# Patient Record
Sex: Female | Born: 1995 | Race: Black or African American | Hispanic: No | Marital: Single | Smoking: Current every day smoker
Health system: Southern US, Community
[De-identification: ages and names within clinical notes are randomized; demographics above are authoritative.]

## PROBLEM LIST (undated history)

## (undated) DIAGNOSIS — M1990 Unspecified osteoarthritis, unspecified site: Secondary | ICD-10-CM

## (undated) HISTORY — PX: KNEE SURGERY: SHX244

---
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
## 2016-05-15 ENCOUNTER — Emergency Department (HOSPITAL_BASED_OUTPATIENT_CLINIC_OR_DEPARTMENT_OTHER): Payer: Medicaid Other

## 2016-05-15 ENCOUNTER — Emergency Department (HOSPITAL_BASED_OUTPATIENT_CLINIC_OR_DEPARTMENT_OTHER)
Admission: EM | Admit: 2016-05-15 | Discharge: 2016-05-15 | Disposition: A | Discharge status: Home / Self Care | Payer: Medicaid Other | Source: Home / Self Care | Attending: Emergency Medicine | Admitting: Emergency Medicine

## 2016-05-15 ENCOUNTER — Emergency Department (HOSPITAL_BASED_OUTPATIENT_CLINIC_OR_DEPARTMENT_OTHER)
Admission: EM | Admit: 2016-05-15 | Discharge: 2016-05-15 | Disposition: A | Discharge status: Home / Self Care | Payer: Medicaid Other | Attending: Emergency Medicine | Admitting: Emergency Medicine

## 2016-05-15 ENCOUNTER — Encounter (HOSPITAL_BASED_OUTPATIENT_CLINIC_OR_DEPARTMENT_OTHER): Payer: Self-pay

## 2016-05-15 ENCOUNTER — Encounter (HOSPITAL_BASED_OUTPATIENT_CLINIC_OR_DEPARTMENT_OTHER): Payer: Self-pay | Admitting: Emergency Medicine

## 2016-05-15 DIAGNOSIS — J209 Acute bronchitis, unspecified: Secondary | ICD-10-CM | POA: Insufficient documentation

## 2016-05-15 DIAGNOSIS — F17210 Nicotine dependence, cigarettes, uncomplicated: Secondary | ICD-10-CM | POA: Insufficient documentation

## 2016-05-15 DIAGNOSIS — R05 Cough: Secondary | ICD-10-CM | POA: Diagnosis present

## 2016-05-15 MED ORDER — ALBUTEROL SULFATE HFA 108 (90 BASE) MCG/ACT IN AERS
2.0000 | INHALATION_SPRAY | Freq: Once | RESPIRATORY_TRACT | Status: AC
  Administered 2016-05-15: 2 via RESPIRATORY_TRACT
  Filled 2016-05-15: qty 6.7

## 2016-05-15 MED ORDER — ALBUTEROL SULFATE (2.5 MG/3ML) 0.083% IN NEBU
5.0000 mg | INHALATION_SOLUTION | Freq: Once | RESPIRATORY_TRACT | Status: AC
  Administered 2016-05-15: 5 mg via RESPIRATORY_TRACT
  Filled 2016-05-15: qty 6

## 2016-05-15 NOTE — Discharge Instructions (Signed)
Return to the ED with any concerns including difficulty breathing despite using albuterol every 4 hours, not drinking fluids, decreased urine output, vomiting and not able to keep down liquids or medications, decreased level of alertness/lethargy, or any other alarming symptoms °

## 2016-05-15 NOTE — ED Notes (Signed)
Pt yelling  at staff that she cannot breathe.  Pt yelling and cursing at staff that she is tired of waiting and wants an inhaler. NAD.  PT yelling in complete sentences.

## 2016-05-15 NOTE — ED Provider Notes (Signed)
[facility] Note   CSN: [id number] Arrival date & time: 05/15/16  1741  By signing my name below, I, Caitlin Edwards, attest that this documentation has been prepared under the direction and in the presence of Caitlin Scott, MD. Electronically Signed: Doreatha Edwards, ED Scribe. 05/15/16. 9:24 PM.   History   Chief Complaint Chief Complaint  Patient presents with  . Cough   LEVEL 5 CAVEAT: HPI and ROS limited due to pt uncooperative    HPI Caitlin Edwards is a 21 y.o. female who presents to the Emergency Department complaining of intermittent cough for 2 days with associated sneezing, wheezing, chills, subjective fever. Pt is insistent that she "can't breath" and refuses to answer questions on exam. No diagnosed h/o asthma. Pt states she frequently has similar problems breathing.    The history is provided by the patient. History limited by: uncooperative. No language interpreter was used.  Cough  This is a new problem. The current episode started 2 days ago. The problem occurs every few minutes. The problem has not changed since onset.There has been no fever.    Past Medical History:  Diagnosis Date  . Osteoarthritis     There are no active problems to display for this patient.   Past Surgical History:  Procedure Laterality Date  . KNEE SURGERY      OB History    No data available       Home Medications    Prior to Admission medications   Not on File    Family History No family history on file.  Social History Social History  Substance Use Topics  . Smoking status: Current Some Day Smoker    Types: Cigarettes  . Smokeless tobacco: Never Used  . Alcohol use No     Allergies   Amoxil [amoxicillin] and Penicillins   Review of Systems Review of Systems  Unable to perform ROS: Other (pt uncooperative )   Physical Exam Updated Vital Signs BP 117/72 (BP Location: Left Arm)   Pulse (!) 105   Temp 99.1 F (37.3 C) (Oral)   Resp 20    Ht  (1.803 m)   Wt (!) 324 lb (147 kg)   LMP 04/24/2016   SpO2 100%   BMI 45.19 kg/m  Vitals reviewed Physical Exam Physical Examination: General appearance - alert, well appearing, and in no distress, yelling and cursing at staff members due wait and screaming that she nearly died while waiting in exam room- refusing to answer history questions Mental status - alert, oriented to person, place, and time Eyes - no conjunctival injection, no scleral icterus Mouth - mucous membranes moist, pharynx normal without lesions Neck - supple, no significant adenopathy Chest - mild bilateral wheezing, no retractions, normal respiratory effort, BSS Heart - normal rate, regular rhythm, normal S1, S2, no murmurs, rubs, clicks or gallops  Neurological - alert, oriented, normal speech Extremities - peripheral pulses normal, no pedal edema, no clubbing or cyanosis Skin - normal coloration and turgor Psych- very anxious, tearful and angry, shouting very loudly and belligerent  ED Treatments / Results   DIAGNOSTIC STUDIES: Oxygen Saturation is 100% on RA, normal by my interpretation.    COORDINATION OF CARE: 9:15 PM Discussed treatment plan with pt at bedside which includes CXR and pt agreed to plan.    Labs (all labs ordered are listed, but only abnormal results are displayed) Labs Reviewed - No data to display  EKG  EKG Interpretation None  Radiology Dg Chest 2 View  Result Date: 05/15/2016 CLINICAL DATA:  Cough, wheezing and shortness of breath for 3 days, history of bronchitis EXAM: CHEST  2 VIEW COMPARISON:  12/20/2014 FINDINGS: Normal heart size, mediastinal contours, and pulmonary vascularity. Lungs clear. No pleural effusion or pneumothorax. Bones unremarkable. IMPRESSION: Normal exam. Electronically Signed   By: Ulyses Southward M.D.   On: 05/15/2016 18:39    Procedures Procedures (including critical care time)  Medications Ordered in ED Medications  albuterol  (PROVENTIL) (2.5 MG/3ML) 0.083% nebulizer solution 5 mg (5 mg Nebulization Given 05/15/16 2135)  albuterol (PROVENTIL HFA;VENTOLIN HFA) 108 (90 Base) MCG/ACT inhaler 2 puff (2 puffs Inhalation Given 05/15/16 2149)     Initial Impression / Assessment and Plan / ED Course  I have reviewed the triage vital signs and the nursing notes.  Pertinent labs & imaging results that were available during my care of the patient were reviewed by me and considered in my medical decision making (see chart for details).   in to see patient, she has been yelling and shouting about the long wait- she is cursing and shouting that she feels she nearly died in the exam room and was not able to call for help.  I entered the room and asked her to please tell me her symptoms.  She replied that "you are the doctor why are you asking me questions"   I explained that I needed to find out some information about her symptoms to better understand the cause of her feeling short of breath.   She yelled "I feel like I can't breathe, that's all you need to know"- of note she had no signs of respiratory distress.  I referred to the triage note mentioning she had flu like symptoms- she then said "I never said that- I'm only 21 years old- I'm calling my mother".  She then called her mother and talked with her on the phone, while I waited in the room, the charge nurse came into the room to explain to the patient that there were many other patients waiting and could she please get off the phone.  Patient continued to yell and did not want to answer questions.  I and the charge nurse explained numerous times that we were trying to help her and apologized for the wait time.  I then asked if it would be alright if I examined her.  She said "yes" but was still very visibly upset and angry.  She did have some mild wheezing bilaterally.  I discussed with her that I did hear some wheezing, that I was going to order a breathing treatment and then she would  have an albuterol inhaler to go home with before discharge. I also let her know that her xray was normal..    Pt with most likley viral bronchitis- discharged in stable condition with allbuterol inhaler.  Discharged with strict return precautions.  Pt agreeable with plan.   Final Clinical Impressions(s) / ED Diagnoses   Final diagnoses:  Bronchospasm with bronchitis, acute    New Prescriptions There are no discharge medications for this patient.   I personally performed the services described in this documentation, which was scribed in my presence. The recorded information has been reviewed and is accurate.     Caitlin Scott, MD 05/16/16 [phone]

## 2016-05-15 NOTE — ED Notes (Signed)
Pt left before being seen by EDP. Sts she had to be at work, but she'll come back later.

## 2016-05-15 NOTE — ED Triage Notes (Signed)
c/o flu like sx x 3 days-NAD-steady gait-LWBS today

## 2016-05-15 NOTE — ED Notes (Signed)
ED Provider at bedside. 

## 2016-05-15 NOTE — ED Triage Notes (Signed)
Pt having runny nose, cough, ear pain, chest congestion for several days.

## 2021-03-30 ENCOUNTER — Encounter (HOSPITAL_COMMUNITY): Payer: Self-pay

## 2021-03-30 ENCOUNTER — Emergency Department (HOSPITAL_COMMUNITY): Payer: Medicaid Other

## 2021-03-30 ENCOUNTER — Other Ambulatory Visit: Payer: Self-pay

## 2021-03-30 ENCOUNTER — Emergency Department (HOSPITAL_COMMUNITY)
Admission: EM | Admit: 2021-03-30 | Discharge: 2021-03-30 | Disposition: A | Discharge status: Home / Self Care | Payer: Medicaid Other | Attending: Emergency Medicine | Admitting: Emergency Medicine

## 2021-03-30 ENCOUNTER — Emergency Department (HOSPITAL_COMMUNITY)
Admission: EM | Admit: 2021-03-30 | Discharge: 2021-03-30 | Disposition: A | Discharge status: Home / Self Care | Payer: Self-pay | Attending: Emergency Medicine | Admitting: Emergency Medicine

## 2021-03-30 DIAGNOSIS — Z5900 Homelessness unspecified: Secondary | ICD-10-CM | POA: Insufficient documentation

## 2021-03-30 DIAGNOSIS — G8929 Other chronic pain: Secondary | ICD-10-CM | POA: Insufficient documentation

## 2021-03-30 DIAGNOSIS — M79604 Pain in right leg: Secondary | ICD-10-CM | POA: Insufficient documentation

## 2021-03-30 DIAGNOSIS — M25561 Pain in right knee: Secondary | ICD-10-CM | POA: Insufficient documentation

## 2021-03-30 DIAGNOSIS — Z046 Encounter for general psychiatric examination, requested by authority: Secondary | ICD-10-CM | POA: Insufficient documentation

## 2021-03-30 NOTE — ED Notes (Signed)
Pt provided crackers and peanut butter with something to drink per providers request. Caitlin Edwards to walk away and pt stated that she had a questions. When asked the pt what her question was she stated "what did she need to do to check into a psyc facility" advised pt this RN would have to speak with the provider and make the provider aware of same

## 2021-03-30 NOTE — ED Provider Notes (Signed)
[facility] EMERGENCY DEPARTMENT Provider Note   CSN: [id number] Arrival date & time: 03/30/21  1324     History  Chief Complaint  Patient presents with   Psychiatric Evaluation    Caitlin Edwards is a 26 y.o. female.  This is a 26 y.o. female  with significant medical history as below, including homeless who presents to the ED with complaint of seeking some place to sleep.  Patient ports that she was at [facility], she left in the lobby.  She was requesting to stay in the hospital overnight because she did not want to stay with her mom or to be outside.  She is homeless.  She denies hallucinations or delusions, no suicidal or homicidal ideation or plan.  No acute medical complaints.  Denies daily medications.  Denies any illicit drug use or alcohol use.  No trauma.  No fevers or chills.  No change in bowel or bladder function, no change to oral intake  Per chart review patient was discharged from [facility]  ED earlier this morning, diagnosed with malingering.  Per EMS patient was also trespassed from that facility.   Past Medical History: No date: Osteoarthritis  Past Surgical History: No date: KNEE SURGERY    The history is provided by the patient. No language interpreter was used.      Home Medications Prior to Admission medications   Medication Sig Start Date End Date Taking? Authorizing Provider  mometasone (NASONEX) 50 MCG/ACT nasal spray Place 2 sprays into the nose daily. 07/13/14 03/27/15  Hess, Nada Boozer, PA-C      Allergies    Amoxil [amoxicillin] and Penicillins    Review of Systems   Review of Systems  Constitutional:  Negative for activity change and fever.  HENT:  Negative for facial swelling and trouble swallowing.   Eyes:  Negative for discharge and redness.  Respiratory:  Negative for cough and shortness of breath.   Cardiovascular:  Negative for chest pain and palpitations.  Gastrointestinal:  Negative for abdominal  pain and nausea.  Genitourinary:  Negative for dysuria and flank pain.  Musculoskeletal:  Negative for back pain and gait problem.  Skin:  Negative for pallor and rash.  Neurological:  Negative for syncope and headaches.   Physical Exam Updated Vital Signs BP (!) 140/120 (BP Location: Right Arm)    Pulse 95    Temp 97.9 F (36.6 C) (Oral)    Resp 16    Ht 5\' 11"  (1.803 m)    Wt (!) 147 kg    SpO2 98%    BMI 45.20 kg/m  Physical Exam Vitals and nursing note reviewed.  Constitutional:      General: She is not in acute distress.    Appearance: Normal appearance. She is obese.  HENT:     Head: Normocephalic and atraumatic. No raccoon eyes, Battle's sign, right periorbital erythema or left periorbital erythema.     Right Ear: External ear normal.     Left Ear: External ear normal.     Nose: Nose normal.     Mouth/Throat:     Mouth: Mucous membranes are moist.  Eyes:     General: No scleral icterus.       Right eye: No discharge.        Left eye: No discharge.  Cardiovascular:     Rate and Rhythm: Normal rate and regular rhythm.     Pulses: Normal pulses.     Heart sounds: Normal heart  sounds.  Pulmonary:     Effort: Pulmonary effort is normal. No respiratory distress.     Breath sounds: Normal breath sounds.  Abdominal:     General: Abdomen is flat.     Tenderness: There is no abdominal tenderness.  Musculoskeletal:        General: Normal range of motion.     Cervical back: Normal range of motion.     Right lower leg: No edema.     Left lower leg: No edema.  Skin:    General: Skin is warm and dry.     Capillary Refill: Capillary refill takes less than 2 seconds.  Neurological:     Mental Status: She is alert and oriented to person, place, and time.     GCS: GCS eye subscore is 4. GCS verbal subscore is 5. GCS motor subscore is 6.  Psychiatric:        Attention and Perception: Attention and perception normal. She does not perceive auditory or visual hallucinations.         Mood and Affect: Mood normal. Affect is not angry.        Speech: Speech normal.        Behavior: Behavior normal. Behavior is not agitated, aggressive or combative. Behavior is cooperative.        Thought Content: Thought content normal. Thought content does not include homicidal or suicidal ideation. Thought content does not include homicidal or suicidal plan.    ED Results / Procedures / Treatments   Labs (all labs ordered are listed, but only abnormal results are displayed) Labs Reviewed - No data to display  EKG None  Radiology No results found.  Procedures Procedures    Medications Ordered in ED Medications - No data to display  ED Course/ Medical Decision Making/ A&P                           Medical Decision Making   CC: Homeless  This patient presents to the Emergency Department for the above complaint. This involves an extensive number of treatment options and is a complaint that carries with it a high risk of complications and morbidity. Vital signs were reviewed. Serious etiologies considered.  Record review:  Previous records obtained and reviewed   Additional history obtained from EMS  Medical and surgical history as noted above.   Work up as above, notable for:   Social determinants of health include - homelessness  Management: Pt given food, something to drink, o/p resources regarding shelters in the area.  Given info regarding [facility].  She is not psychotic.  Not intoxicated.  Ambulatory w/ steady gait.   Pt is homeless, asking if she can "stay the night in the ER." No acute psychiatric disturbance noted on my evaluation. Medically stable. Discussed local shelter options with the patient and community resources that she could utilize. Pt's mother also lives locally and advised her to call her mother to help assist with her housing.    Stable for discharge   The patient improved significantly and was discharged in stable condition. Detailed  discussions were had with the patient regarding current findings, and need for close f/u with PCP or on call doctor. The patient has been instructed to return immediately if the symptoms worsen in any way for re-evaluation. Patient verbalized understanding and is in agreement with current care plan. All questions answered prior to discharge.      This chart was dictated using  voice recognition software.  Despite best efforts to proofread,  errors can occur which can change the documentation meaning.         Final Clinical Impression(s) / ED Diagnoses Final diagnoses:  Homeless    Rx / DC Orders ED Discharge Orders     None         Sloan Leiter, DO 03/30/21 [phone]

## 2021-03-30 NOTE — Discharge Instructions (Addendum)
It was a pleasure caring for you today in the emergency department. ° °Please return to the emergency department for any worsening or worrisome symptoms. ° ° °

## 2021-03-30 NOTE — ED Notes (Signed)
Pt was previously seen in ED at 0553AM this morning for abdominal pain. Pt was discharged and is now back for right leg pain. Pt states she walked to [address] and right leg pain worsened. Denies hx of falls/injury. No obvious swelling or deformity. Alert and oriented x 4. Will continue to monitor.

## 2021-03-30 NOTE — ED Provider Notes (Signed)
[facility] EMERGENCY DEPARTMENT Provider Note   CSN: [id number] Arrival date & time: 03/30/21  0160     History  Chief Complaint  Patient presents with   Leg Pain    Caitlin Edwards is a 26 y.o. female presenting due to right knee pain.  Of note she was discharged a few hours ago.  Patient has a history of homelessness and malingering.  She says that her knee pain has been "bothering me since I was a little girl."  Says that it is difficult to bear weight or walk on it.  She left the hospital earlier, walked up to [facility] and called 911.  Home Medications Prior to Admission medications   Medication Sig Start Date End Date Taking? Authorizing Provider  mometasone (NASONEX) 50 MCG/ACT nasal spray Place 2 sprays into the nose daily. 07/13/14 03/27/15  Hess, Nada Boozer, PA-C      Allergies    Amoxil [amoxicillin] and Penicillins    Review of Systems   Review of Systems See HPI  Physical Exam Updated Vital Signs BP 112/70 (BP Location: Right Arm)    Pulse 78    Temp 97.7 F (36.5 C) (Oral)    Resp 15    Ht 5\' 11"  (1.803 m)    Wt (!) 147 kg    SpO2 99%    BMI 45.20 kg/m  Physical Exam Vitals and nursing note reviewed.  Constitutional:      Appearance: Normal appearance.  HENT:     Head: Normocephalic and atraumatic.  Eyes:     General: No scleral icterus.    Conjunctiva/sclera: Conjunctivae normal.  Pulmonary:     Effort: Pulmonary effort is normal. No respiratory distress.  Musculoskeletal:     Comments: Tenderness to palpation of the anterior, medial and lateral.  No abrasions or lacerations.  Skin:    Findings: No rash.  Neurological:     Mental Status: She is alert.  Psychiatric:        Mood and Affect: Mood normal.    ED Results / Procedures / Treatments   Labs (all labs ordered are listed, but only abnormal results are displayed) Labs Reviewed - No data to display  EKG None  Radiology DG Knee Right Port  Result Date:  03/30/2021 CLINICAL DATA:  Pain without injury EXAM: PORTABLE RIGHT KNEE - 1-2 VIEW COMPARISON:  11/29/2013 FINDINGS: Degenerative marginal spurring which is generalized. Mild medial compartment narrowing is possible. No joint effusion, fracture, or subluxation. IMPRESSION: Generalized soft tissue swelling. Premature degenerative spurring which is generalized. Electronically Signed   By: 12/01/2013 M.D.   On: 03/30/2021 09:45    Procedures Procedures   Medications Ordered in ED Medications - No data to display  ED Course/ Medical Decision Making/ A&P                           Medical Decision Making Amount and/or Complexity of Data Reviewed Radiology: ordered.   26 year old female presenting with knee pain that she says has been present all her life.  She was discharged from the emergency department at around 630 this morning after psychiatric evaluation and requesting to spend the night in the emergency department.  She was given local shelter options and her mother lives in town but she does not want to stay with her.  X-ray of the knee was ordered and interpreted by me.  I agree with the radiologist that there are no acute  findings, just to degeneration that was noted on past scans.  Patient was told this and then asked about multiple other bumps and scars around her body.  I suspect this visit is secondary to homelessness and a desire to stay in the emergency department.  I do not have any indication to keep her here and she will be discharged at this time with knee immobilizer, crutches and the suggestion to call her mother.  Final Clinical Impression(s) / ED Diagnoses Final diagnoses:  Chronic pain of right knee    Rx / DC Orders Results and diagnoses were explained to the patient. Return precautions discussed in full. Patient had no additional questions and expressed complete understanding.   This chart was dictated using voice recognition software.  Despite best efforts to  proofread,  errors can occur which can change the documentation meaning.    Woodroe Chen 03/30/21 0957    Linwood Dibbles, MD 03/31/21 [phone]

## 2021-03-30 NOTE — ED Triage Notes (Signed)
Picked up by EMS across the street. Came here for right knee pain. Denies injury/falls. No swelling or obvious deformity per EMS. This is 2nd visit today with last one at 52AM.

## 2021-03-30 NOTE — ED Triage Notes (Signed)
BIB [facility]. Pt called EMS multiple times tonight and was transported to [facility] pt [facility] x 3 and was trespassed on the 3rd time. Pt called EMS again from outside of [facility] and requested to be transported here. C/o abd pain, [place] and weakness from a contaminated dollar bill from several weeks ago. Pt has had an appetite and ate and drank tonight and able to tolerate same. Denies SI/HI or any psych hx.

## 2021-03-30 NOTE — Discharge Instructions (Addendum)
There is degeneration in the right knee.  Please use the knee brace when walking.  Crutches should also be utilized.  Read the information about joint pain attached to these discharge papers.

## 2021-03-30 NOTE — Progress Notes (Signed)
Orthopedic Tech Progress Note Patient Details:  Caitlin Edwards June 03, 1995 [id number]  Ortho Devices Type of Ortho Device: Knee Immobilizer Ortho Device/Splint Location: rle Ortho Device/Splint Interventions: Ordered, Application, Adjustment   Post Interventions Patient Tolerated: Well  Edwina Barth 03/30/2021, 10:23 AM

## 2021-04-10 ENCOUNTER — Encounter (HOSPITAL_COMMUNITY): Payer: Self-pay | Admitting: Emergency Medicine

## 2021-04-10 ENCOUNTER — Other Ambulatory Visit: Payer: Self-pay

## 2021-04-10 ENCOUNTER — Emergency Department (HOSPITAL_COMMUNITY)
Admission: EM | Admit: 2021-04-10 | Discharge: 2021-04-10 | Disposition: A | Discharge status: Home / Self Care | Payer: Medicaid Other | Attending: Emergency Medicine | Admitting: Emergency Medicine

## 2021-04-10 DIAGNOSIS — Z5900 Homelessness unspecified: Secondary | ICD-10-CM | POA: Insufficient documentation

## 2021-04-10 DIAGNOSIS — R112 Nausea with vomiting, unspecified: Secondary | ICD-10-CM | POA: Insufficient documentation

## 2021-04-10 NOTE — ED Notes (Signed)
Refusing to provide urine specimen. Refusing to eat / drink.

## 2021-04-10 NOTE — ED Provider Notes (Signed)
[facility] EMERGENCY DEPARTMENT Provider Note   CSN: [id number] Arrival date & time: 04/10/21  0004     History  Chief Complaint  Patient presents with   Accidentally Drank Urine     Caitlin Edwards is a 26 y.o. female.  HPI     This is a 26 year old female with no reported past medical history who presents with nausea and vomiting.  Patient reports "I am sick."  Reports an episode of emesis earlier today.  She is currently homeless and living on the streets.  She initially told triage that she had vomiting last week after drinking her urine.  However, she endorses to me that she vomited once today.  She is not having any abdominal pain.  Reports normal bowel movements.  Does not believe herself to be pregnant.  Reports that she has a lead on a place to stay but "it will not be ready until next week."  Home Medications Prior to Admission medications   Medication Sig Start Date End Date Taking? Authorizing Provider  mometasone (NASONEX) 50 MCG/ACT nasal spray Place 2 sprays into the nose daily. 07/13/14 03/27/15  Hess, Nada Boozer, PA-C      Allergies    Amoxil [amoxicillin] and Penicillins    Review of Systems   Review of Systems  Constitutional:  Negative for fever.  Respiratory:  Negative for shortness of breath.   Cardiovascular:  Negative for chest pain.  Gastrointestinal:  Positive for vomiting.  All other systems reviewed and are negative.  Physical Exam Updated Vital Signs BP 138/85 (BP Location: Right Arm)    Pulse 86    Temp 97.8 F (36.6 C) (Oral)    Resp 15    SpO2 100%  Physical Exam Vitals and nursing note reviewed.  Constitutional:      Appearance: She is well-developed.     Comments: Disheveled appearing but nontoxic, obese  HENT:     Head: Normocephalic and atraumatic.     Mouth/Throat:     Mouth: Mucous membranes are moist.  Eyes:     Pupils: Pupils are equal, round, and reactive to light.  Cardiovascular:     Rate and Rhythm:  Normal rate and regular rhythm.     Heart sounds: Normal heart sounds.  Pulmonary:     Effort: Pulmonary effort is normal. No respiratory distress.     Breath sounds: No wheezing.  Abdominal:     General: There is no distension.     Palpations: Abdomen is soft.  Musculoskeletal:     Cervical back: Neck supple.  Skin:    General: Skin is warm and dry.  Neurological:     Mental Status: She is alert and oriented to person, place, and time.  Psychiatric:        Mood and Affect: Mood normal.    ED Results / Procedures / Treatments   Labs (all labs ordered are listed, but only abnormal results are displayed) Labs Reviewed  URINALYSIS, ROUTINE W REFLEX MICROSCOPIC  PREGNANCY, URINE    EKG None  Radiology No results found.  Procedures Procedures    Medications Ordered in ED Medications - No data to display  ED Course/ Medical Decision Making/ A&P                           Medical Decision Making Amount and/or Complexity of Data Reviewed Labs: ordered.   This patient presents to the ED for concern of vomiting, drinking  her own pee, this involves an extensive number of treatment options, and is a complaint that carries with it a high risk of complications and morbidity.  The differential diagnosis includes gastritis, gastroenteritis, malingering  MDM:    This is a 26 year old female who presents with reported vomiting and drinking her own urine.  She is very vague in her history taking.  She is adamant that she vomited earlier today.  Has no abdominal pain.  At some point the last 2 weeks has also dropped her own urine.  Unclear why.  She is refusing any work-up and just wants to sleep in the hallway.  She will not provide urine.  I have tried to orally hydrate her and give her something to eat.  She continues to refuse.  Highly suspect that she is looking for a place to sleep.  Her exam is benign.  Have low suspicion for acute emergent process. (Labs, imaging)  Labs: I  Ordered, and personally interpreted labs.  The pertinent results include: None  Imaging Studies ordered: I ordered imaging studies including none I independently visualized and interpreted imaging. I agree with the radiologist interpretation  Additional history obtained from chart review.  External records from outside source obtained and reviewed including prior visit  Critical Interventions: None  Consultations: I requested consultation with the none,  and discussed lab and imaging findings as well as pertinent plan - they recommend: None  Cardiac Monitoring: The patient was maintained on a cardiac monitor.  I personally viewed and interpreted the cardiac monitored which showed an underlying rhythm of: N/A  Reevaluation: After the interventions noted above, I reevaluated the patient and found that they have :stayed the same   Considered admission for: N/A  Social Determinants of Health: Homeless  Disposition: Discharge  Co morbidities that complicate the patient evaluation  Past Medical History:  Diagnosis Date   Osteoarthritis      Medicines No orders of the defined types were placed in this encounter.   I have reviewed the patients home medicines and have made adjustments as needed  Problem List / ED Course: Problem List Items Addressed This Visit   None Visit Diagnoses     Homeless    -  Primary                   Final Clinical Impression(s) / ED Diagnoses Final diagnoses:  Homeless    Rx / DC Orders ED Discharge Orders     None         Aletta Edmunds, Mayer Masker, MD 04/10/21 [phone]

## 2021-04-10 NOTE — ED Triage Notes (Signed)
Patient arrived with EMS from street ( homeless) accidentally drank her urine 2 weeks ago , reports emesis x2 last week . No nausea or pain at arrival . Denies SI or HI . No hallucinations.

## 2021-04-10 NOTE — ED Notes (Signed)
Refused discharge vital signs.

## 2021-04-17 ENCOUNTER — Encounter (HOSPITAL_COMMUNITY): Payer: Self-pay

## 2021-04-17 ENCOUNTER — Emergency Department (HOSPITAL_COMMUNITY)
Admission: EM | Admit: 2021-04-17 | Discharge: 2021-04-17 | Disposition: A | Discharge status: Home / Self Care | Payer: Medicaid Other | Attending: Emergency Medicine | Admitting: Emergency Medicine

## 2021-04-17 ENCOUNTER — Other Ambulatory Visit: Payer: Self-pay

## 2021-04-17 DIAGNOSIS — Z5900 Homelessness unspecified: Secondary | ICD-10-CM | POA: Insufficient documentation

## 2021-04-17 DIAGNOSIS — R519 Headache, unspecified: Secondary | ICD-10-CM | POA: Insufficient documentation

## 2021-04-17 MED ORDER — ACETAMINOPHEN 500 MG PO TABS
1000.0000 mg | ORAL_TABLET | Freq: Once | ORAL | Status: AC
  Administered 2021-04-17: 1000 mg via ORAL
  Filled 2021-04-17: qty 2

## 2021-04-17 NOTE — Progress Notes (Signed)
CSW spoke with patient about shelter resources. Patient stated she was staying in a shelter in [facility] but got kicked out because the "lady" stated she was doing something that she wasn't. Patient went on to say that people shouldn't be homeless and that animals sleep outside and not people. Patient stated she did not want any shelter resources. CSW told patient she would leave the list with her nurse if she changes her mind. CSW also attached it to patients AVS. Patient stated she did not need anything else.  ?

## 2021-04-17 NOTE — Discharge Instructions (Addendum)
Follow-up with a primary care doctor for further care and treatment as soon as possible.  If you stay in [place], you can go to the [facility]. ?

## 2021-04-17 NOTE — ED Provider Triage Note (Signed)
Emergency Medicine Provider Triage Evaluation Note ? ?Caitlin Edwards , a 26 y.o. female  was evaluated in triage.  Pt complains of being able to taste the medicine she was administered at her last ED visit when she pushes on her forehead. Also notes a frontal headache. No medications taken PTA. She is homeless. ? ?Review of Systems  ?Positive: As above ?Negative: As above ? ?Physical Exam  ?BP 127/84 (BP Location: Right Arm)   Pulse (!) 109   Temp 98.4 ?F (36.9 ?C) (Oral)   Resp 18   SpO2 100%  ?Gen:   Awake, no distress   ?Resp:  Normal effort  ?MSK:   Moves extremities without difficulty  ?Other:  Speech clear. No focal deficits noted. ? ?Medical Decision Making  ?Medically screening exam initiated at 1:08 AM.  Appropriate orders placed.  Caitlin Edwards was informed that the remainder of the evaluation will be completed by another provider, this initial triage assessment does not replace that evaluation, and the importance of remaining in the ED until their evaluation is complete. ? ?Frontal headache; homelessness ?  ?Antonietta Breach, PA-C ?04/17/21 0113 ? ?

## 2021-04-17 NOTE — ED Notes (Signed)
Patient reports tasting medicine when she presses on her forehead and states that she feels like she is going blind. Patient states that she would take care of this at home but doesn't have a home right now. Patient states that she thinks she has "medicine foot" because she has a callous on her foot. Endorses headache 10/10. VSS. ?

## 2021-04-17 NOTE — ED Notes (Signed)
Pt given sandwich bag 

## 2021-04-17 NOTE — ED Provider Notes (Signed)
?[facility] EMERGENCY DEPARTMENT ?Provider Note ? ? ?CSN: [id number] ?Arrival date & time: 04/17/21  0023 ? ?  ? ?History ? ?Chief Complaint  ?Patient presents with  ? Headache  ? ? ?Caitlin Edwards is a 26 y.o. female. ? ?HPI ?She presents to the ED for evaluation of headache.  She was previously evaluated for the same problem at another facility, yesterday.  She had a prolonged ED waiting room evaluation, prior to being seen by me at this time (11:30 AM).  She is reported to be homeless and has had multiple recent ED evaluations.  It appears this started in November 2022 and has been persistent since then. ? ?Patient came here by EMS from [place], [place] to be evaluated.  She feels like she can still taste medicine that she took yesterday morning, for headache.  She states she is supposed to be on medications for blood pressure but cannot afford it.  She states that she gets a check every month, the next one is due to be sent to her in 6 days.  She has to pick it up from the post office in [facility].  She has had some of abdominal cramping recently.  She denies vomiting, fever, cough or shortness of breath.  She states she wants to talk to the Child psychotherapist. ?  ? ?Home Medications ?Prior to Admission medications   ?Medication Sig Start Date End Date Taking? Authorizing Provider  ?mometasone (NASONEX) 50 MCG/ACT nasal spray Place 2 sprays into the nose daily. 07/13/14 03/27/15  Hess, Nada Boozer, PA-C  ?   ? ?Allergies    ?Penicillins, Amoxil [amoxicillin], and Sulfamethoxazole   ? ?Review of Systems   ?Review of Systems ? ?Physical Exam ?Updated Vital Signs ?BP (!) 158/84 (BP Location: Right Arm)   Pulse 94   Temp 97.7 ?F (36.5 ?C) (Oral)   Resp 16   Ht 5\' 11"  (1.803 m)   Wt (!) 147 kg   SpO2 100%   BMI 45.19 kg/m?  ?Physical Exam ?Vitals and nursing note reviewed.  ?Constitutional:   ?   General: She is not in acute distress. ?   Appearance: She is well-developed. She is obese. She is  not ill-appearing, toxic-appearing or diaphoretic.  ?HENT:  ?   Head: Normocephalic and atraumatic.  ?   Right Ear: External ear normal.  ?   Left Ear: External ear normal.  ?Eyes:  ?   Conjunctiva/sclera: Conjunctivae normal.  ?   Pupils: Pupils are equal, round, and reactive to light.  ?Neck:  ?   Trachea: Phonation normal.  ?Cardiovascular:  ?   Rate and Rhythm: Normal rate.  ?Pulmonary:  ?   Effort: Pulmonary effort is normal.  ?Abdominal:  ?   General: There is no distension.  ?Musculoskeletal:     ?   General: Normal range of motion.  ?   Cervical back: Normal range of motion and neck supple.  ?Skin: ?   General: Skin is warm and dry.  ?Neurological:  ?   Mental Status: She is alert and oriented to person, place, and time.  ?   Cranial Nerves: No cranial nerve deficit.  ?   Sensory: No sensory deficit.  ?   Motor: No abnormal muscle tone.  ?   Coordination: Coordination normal.  ?Psychiatric:     ?   Mood and Affect: Mood normal.     ?   Behavior: Behavior normal.  ?   Comments: She has  rambling thoughts and concerns, but appears to be goal oriented to protect herself and finding food.  ? ? ?ED Results / Procedures / Treatments   ?Labs ?(all labs ordered are listed, but only abnormal results are displayed) ?Labs Reviewed - No data to display ? ?EKG ?None ? ?Radiology ?No results found. ? ?Procedures ?Procedures  ? ? ?Medications Ordered in ED ?Medications  ?acetaminophen (TYLENOL) tablet 1,000 mg (1,000 mg Oral Given 04/17/21 0121)  ? ? ?ED Course/ Medical Decision Making/ A&P ?Clinical Course as of 04/17/21 1231  ?Thu Apr 17, 2021  ?1230 She has been seen by social work and given resources. [EW]  ?  ?Clinical Course User Index ?[EW] Mancel Bale, MD  ? ?                        ?Medical Decision Making ?Patient here for evaluation of homelessness and a place to get assistance in [place].  She usually lives in [place]. ? ?Amount and/or Complexity of Data Reviewed ?Independent Historian:  ?   Details: She  is a cogent historian ?External Data Reviewed: labs and notes. ?   Details: Prior evaluations at the emergency department in [facility].  Previously prescribed antihypertensive medication, not currently taking it ? ?Risk ?Decision regarding hospitalization. ?Diagnosis or treatment significantly limited by social determinants of health. ?Risk Details: Patient presenting with vague symptoms not requiring evaluation in this ED, or hospitalization at this time.  Patient was connected with the case management and social work team, to receive appropriate assistance.  Anticipate she will be discharged after talking with them. ? ? ? ? ? ? ? ? ? ? ?Final Clinical Impression(s) / ED Diagnoses ?Final diagnoses:  ?Homelessness  ? ? ?Rx / DC Orders ?ED Discharge Orders   ? ? None  ? ?  ? ? ?  ?Mancel Bale, MD ?04/17/21 1231 ? ?

## 2021-04-17 NOTE — ED Triage Notes (Signed)
Arrives EMS with c/o headache. Pt admits when she presses on her forehead she can taste tylenol she was given at previous encounter. Also reports socioeconomic difficulties.  ?

## 2022-02-13 DIAGNOSIS — R112 Nausea with vomiting, unspecified: Secondary | ICD-10-CM | POA: Diagnosis not present

## 2022-02-13 DIAGNOSIS — R197 Diarrhea, unspecified: Secondary | ICD-10-CM | POA: Diagnosis not present

## 2022-02-13 DIAGNOSIS — Z3202 Encounter for pregnancy test, result negative: Secondary | ICD-10-CM | POA: Diagnosis not present

## 2022-06-06 DIAGNOSIS — R1013 Epigastric pain: Secondary | ICD-10-CM | POA: Diagnosis not present

## 2022-06-06 DIAGNOSIS — K219 Gastro-esophageal reflux disease without esophagitis: Secondary | ICD-10-CM | POA: Diagnosis not present

## 2022-06-10 DIAGNOSIS — K819 Cholecystitis, unspecified: Secondary | ICD-10-CM | POA: Diagnosis not present

## 2022-06-10 DIAGNOSIS — K8020 Calculus of gallbladder without cholecystitis without obstruction: Secondary | ICD-10-CM | POA: Diagnosis not present

## 2022-06-10 DIAGNOSIS — R112 Nausea with vomiting, unspecified: Secondary | ICD-10-CM | POA: Diagnosis not present

## 2022-06-10 DIAGNOSIS — K3580 Unspecified acute appendicitis: Secondary | ICD-10-CM | POA: Diagnosis not present

## 2022-06-10 DIAGNOSIS — K810 Acute cholecystitis: Secondary | ICD-10-CM | POA: Diagnosis not present

## 2022-06-10 DIAGNOSIS — R1011 Right upper quadrant pain: Secondary | ICD-10-CM | POA: Diagnosis not present

## 2022-06-10 DIAGNOSIS — K8012 Calculus of gallbladder with acute and chronic cholecystitis without obstruction: Secondary | ICD-10-CM | POA: Diagnosis not present

## 2022-06-10 DIAGNOSIS — R109 Unspecified abdominal pain: Secondary | ICD-10-CM | POA: Diagnosis not present

## 2022-06-10 DIAGNOSIS — I10 Essential (primary) hypertension: Secondary | ICD-10-CM | POA: Diagnosis not present

## 2022-06-11 DIAGNOSIS — K819 Cholecystitis, unspecified: Secondary | ICD-10-CM | POA: Diagnosis not present

## 2022-06-24 ENCOUNTER — Telehealth: Payer: Self-pay

## 2022-06-24 NOTE — Telephone Encounter (Signed)
No working #. AS, CMA 

## 2022-08-03 DIAGNOSIS — R5383 Other fatigue: Secondary | ICD-10-CM | POA: Diagnosis not present

## 2022-09-22 DIAGNOSIS — R000 Tachycardia, unspecified: Secondary | ICD-10-CM | POA: Diagnosis not present

## 2022-09-22 DIAGNOSIS — M7918 Myalgia, other site: Secondary | ICD-10-CM | POA: Diagnosis not present

## 2022-09-24 DIAGNOSIS — F989 Unspecified behavioral and emotional disorders with onset usually occurring in childhood and adolescence: Secondary | ICD-10-CM | POA: Diagnosis not present

## 2022-09-28 DIAGNOSIS — Z0000 Encounter for general adult medical examination without abnormal findings: Secondary | ICD-10-CM | POA: Diagnosis not present

## 2022-09-28 DIAGNOSIS — I10 Essential (primary) hypertension: Secondary | ICD-10-CM | POA: Diagnosis not present

## 2022-09-28 DIAGNOSIS — Z5900 Homelessness unspecified: Secondary | ICD-10-CM | POA: Diagnosis not present

## 2022-09-28 DIAGNOSIS — R000 Tachycardia, unspecified: Secondary | ICD-10-CM | POA: Diagnosis not present

## 2022-10-15 DIAGNOSIS — K029 Dental caries, unspecified: Secondary | ICD-10-CM | POA: Diagnosis not present

## 2022-10-16 DIAGNOSIS — R443 Hallucinations, unspecified: Secondary | ICD-10-CM | POA: Diagnosis not present

## 2022-11-03 DIAGNOSIS — M79604 Pain in right leg: Secondary | ICD-10-CM | POA: Diagnosis not present

## 2022-11-03 DIAGNOSIS — M79605 Pain in left leg: Secondary | ICD-10-CM | POA: Diagnosis not present

## 2022-11-27 DIAGNOSIS — Z59811 Housing instability, housed, with risk of homelessness: Secondary | ICD-10-CM | POA: Diagnosis not present

## 2022-11-27 DIAGNOSIS — R509 Fever, unspecified: Secondary | ICD-10-CM | POA: Diagnosis not present

## 2022-11-27 DIAGNOSIS — H5710 Ocular pain, unspecified eye: Secondary | ICD-10-CM | POA: Diagnosis not present

## 2022-11-27 DIAGNOSIS — R42 Dizziness and giddiness: Secondary | ICD-10-CM | POA: Diagnosis not present

## 2022-11-28 DIAGNOSIS — Z59811 Housing instability, housed, with risk of homelessness: Secondary | ICD-10-CM | POA: Diagnosis not present

## 2022-11-28 DIAGNOSIS — R509 Fever, unspecified: Secondary | ICD-10-CM | POA: Diagnosis not present

## 2022-12-02 DIAGNOSIS — R1110 Vomiting, unspecified: Secondary | ICD-10-CM | POA: Diagnosis not present

## 2022-12-02 DIAGNOSIS — Z765 Malingerer [conscious simulation]: Secondary | ICD-10-CM | POA: Diagnosis not present

## 2022-12-02 DIAGNOSIS — R051 Acute cough: Secondary | ICD-10-CM | POA: Diagnosis not present

## 2022-12-03 DIAGNOSIS — Z765 Malingerer [conscious simulation]: Secondary | ICD-10-CM | POA: Diagnosis not present

## 2023-01-20 ENCOUNTER — Inpatient Hospital Stay (HOSPITAL_COMMUNITY)
Admission: AD | Admit: 2023-01-20 | Discharge: 2023-01-20 | Discharge status: Against Medical Advice | Payer: Medicaid Other | Attending: Emergency Medicine | Admitting: Emergency Medicine

## 2023-01-20 DIAGNOSIS — Z3202 Encounter for pregnancy test, result negative: Secondary | ICD-10-CM | POA: Insufficient documentation

## 2023-01-20 DIAGNOSIS — Z5902 Unsheltered homelessness: Secondary | ICD-10-CM | POA: Diagnosis not present

## 2023-01-20 DIAGNOSIS — Z5941 Food insecurity: Secondary | ICD-10-CM | POA: Insufficient documentation

## 2023-01-20 DIAGNOSIS — F17210 Nicotine dependence, cigarettes, uncomplicated: Secondary | ICD-10-CM | POA: Insufficient documentation

## 2023-01-20 DIAGNOSIS — Z59819 Housing instability, housed unspecified: Secondary | ICD-10-CM | POA: Diagnosis not present

## 2023-01-20 DIAGNOSIS — F458 Other somatoform disorders: Secondary | ICD-10-CM | POA: Diagnosis present

## 2023-01-20 DIAGNOSIS — R209 Unspecified disturbances of skin sensation: Secondary | ICD-10-CM | POA: Diagnosis not present

## 2023-01-20 LAB — POCT PREGNANCY, URINE: Preg Test, Ur: NEGATIVE

## 2023-01-20 NOTE — Consult Note (Signed)
Brief Psychiatry Consult Note  Inpt psych consulted while pt in MAU; found to have no pregnancy and transferred to ED. Let primary team in ED know pt needs TTS consult and informed TTS provider  -- dc inpt psych consult -- pt to be seen by TTS  Caitlin Edwards

## 2023-01-20 NOTE — MAU Note (Signed)
...  Caitlin Edwards is a 27 y.o. at Unknown here in MAU reporting: Reports a positive pregnancy test in October. She reports her LMP was sometime in July. She reports she needs a pregnancy test because "December is a new month." She reports she has not been sexually active over the past years. She reports "I've asked for a child. You know [employer], I'm on TV. There's a child in my belly." Denies VB or LOF. Denies pain.  Patient reports she is homeless. She reports she is trying to stay at [facility] and needs documentation that she is pregnant. She reports she just needs a pregnancy test and then she is going to go. She reports she does not have anywhere to stay. RN asked patient if she would like resources for food and housing and she reports "food wouldn't hurt." She reports "I met someone to help me and they helped me get a ride here. I was going to go to [facility] for shelter but it didn't work out."   Denies self harm. Denies SI. Denies abuse of any kind.  LMP: July - unsure of when Onset of complaint: today Pain score: Denies pain.  Vitals:   01/20/23 0911  BP: 121/70  Pulse: 64  Resp: 16  Temp: 98.2 F (36.8 C)  SpO2: 100%     FHT: n/a Lab orders placed from triage:  none

## 2023-01-20 NOTE — MAU Provider Note (Signed)
History     CSN: [id number]  Arrival date and time: 01/20/23 9563   Event Date/Time   First Provider Initiated Contact with Patient 01/20/23 0957      Chief Complaint  Patient presents with   Psychiatric Evaluation   Patient reports in October she had a positive home pregnancy test. Reports LMP was in July, reports she is about 18 or 19 weeks. Reports [facility] Reports she was trying to get in to [place] [facility], but they wouldn't let her in.  Reports she was staying with her great aunt, but she told her to "Get out." Here for pregnancy test today, reports she still has not had a period.   Possible Pregnancy Pertinent negatives include no abdominal pain, chest pain, chills, coughing, fatigue, fever, nausea or vomiting.      OB History   No obstetric history on file.     Past Medical History:  Diagnosis Date   Osteoarthritis     Past Surgical History:  Procedure Laterality Date   KNEE SURGERY      No family history on file.  Social History   Tobacco Use   Smoking status: Every Day    Current packs/day: 0.50    Types: Cigarettes   Smokeless tobacco: Never  Substance Use Topics   Alcohol use: No   Drug use: Not Currently    Allergies:  Allergies  Allergen Reactions   Penicillins Hives and Other (See Comments)    [Derm]    Amoxil [Amoxicillin] Hives   Sulfamethoxazole Other (See Comments)    [Derm]    (Not in a hospital admission)   Review of Systems  Constitutional:  Negative for chills, fatigue, fever and unexpected weight change.  Respiratory:  Negative for cough and shortness of breath.   Cardiovascular:  Negative for chest pain and palpitations.  Gastrointestinal:  Negative for abdominal pain, constipation, diarrhea, nausea and vomiting.  Genitourinary:  Negative for difficulty urinating, flank pain, frequency and urgency.   Physical Exam   Blood pressure 121/70, pulse 64, temperature 98.2 F  (36.8 C), temperature source Oral, resp. rate 16, height 5\' 6"  (1.676 m), weight (!) 170.6 kg, SpO2 100%.  Physical Exam Vitals reviewed.  HENT:     Head: Normocephalic.  Cardiovascular:     Rate and Rhythm: Normal rate.  Pulmonary:     Effort: Pulmonary effort is normal.  Skin:    General: Skin is warm and dry.  Psychiatric:        Mood and Affect: Mood is anxious. Affect is not angry.        Speech: Speech is rapid and pressured.        Behavior: Behavior is cooperative.        Thought Content: Thought content is delusional. Thought content does not include homicidal or suicidal ideation. Thought content does not include homicidal or suicidal plan.     MAU Course   Results for orders placed or performed during the hospital encounter of 01/20/23 (from the past 24 hour(s))  Pregnancy, urine POC     Status: None   Collection Time: 01/20/23 10:20 AM  Result Value Ref Range   Preg Test, Ur NEGATIVE NEGATIVE   No results found.  MDM PE Labs: pregnancy POC test  Assessment and Plan  27yo  G0 MSE completed PE completed   - Exam findings discussed.  Patient verbalizes understanding of negative pregnancy test.  - Transfer to main [facility]  ER for psych evaluation and SWC.  - Communicated with ER provider, accept this patient.  - Sent to ER in stable condition.    Richardson Landry MSN, CNM 01/20/2023, 11:37 AM

## 2023-02-13 DIAGNOSIS — Z20822 Contact with and (suspected) exposure to covid-19: Secondary | ICD-10-CM | POA: Diagnosis not present

## 2023-02-13 DIAGNOSIS — G44229 Chronic tension-type headache, not intractable: Secondary | ICD-10-CM | POA: Diagnosis not present

## 2023-02-19 DIAGNOSIS — R051 Acute cough: Secondary | ICD-10-CM | POA: Diagnosis not present

## 2023-02-19 DIAGNOSIS — Z20822 Contact with and (suspected) exposure to covid-19: Secondary | ICD-10-CM | POA: Diagnosis not present

## 2023-02-19 DIAGNOSIS — R519 Headache, unspecified: Secondary | ICD-10-CM | POA: Diagnosis not present

## 2023-02-19 DIAGNOSIS — J069 Acute upper respiratory infection, unspecified: Secondary | ICD-10-CM | POA: Diagnosis not present

## 2023-02-19 DIAGNOSIS — Z765 Malingerer [conscious simulation]: Secondary | ICD-10-CM | POA: Diagnosis not present

## 2023-02-20 DIAGNOSIS — Z20822 Contact with and (suspected) exposure to covid-19: Secondary | ICD-10-CM | POA: Diagnosis not present

## 2023-02-20 DIAGNOSIS — Z765 Malingerer [conscious simulation]: Secondary | ICD-10-CM | POA: Diagnosis not present

## 2023-02-20 DIAGNOSIS — J069 Acute upper respiratory infection, unspecified: Secondary | ICD-10-CM | POA: Diagnosis not present

## 2023-02-21 DIAGNOSIS — R519 Headache, unspecified: Secondary | ICD-10-CM | POA: Diagnosis not present

## 2023-03-11 DIAGNOSIS — R110 Nausea: Secondary | ICD-10-CM | POA: Diagnosis not present

## 2023-03-11 DIAGNOSIS — R140 Abdominal distension (gaseous): Secondary | ICD-10-CM | POA: Diagnosis not present

## 2023-03-11 DIAGNOSIS — N926 Irregular menstruation, unspecified: Secondary | ICD-10-CM | POA: Diagnosis not present

## 2023-06-10 DIAGNOSIS — R519 Headache, unspecified: Secondary | ICD-10-CM | POA: Diagnosis not present

## 2023-06-10 DIAGNOSIS — I10 Essential (primary) hypertension: Secondary | ICD-10-CM | POA: Diagnosis not present

## 2023-06-12 DIAGNOSIS — R519 Headache, unspecified: Secondary | ICD-10-CM | POA: Diagnosis not present

## 2023-06-12 DIAGNOSIS — R21 Rash and other nonspecific skin eruption: Secondary | ICD-10-CM | POA: Diagnosis not present

## 2023-06-12 DIAGNOSIS — Z59819 Housing instability, housed unspecified: Secondary | ICD-10-CM | POA: Diagnosis not present

## 2023-06-13 DIAGNOSIS — R519 Headache, unspecified: Secondary | ICD-10-CM | POA: Diagnosis not present

## 2023-06-13 DIAGNOSIS — Z59819 Housing instability, housed unspecified: Secondary | ICD-10-CM | POA: Diagnosis not present

## 2023-06-14 DIAGNOSIS — R21 Rash and other nonspecific skin eruption: Secondary | ICD-10-CM | POA: Diagnosis not present

## 2023-06-19 DIAGNOSIS — M542 Cervicalgia: Secondary | ICD-10-CM | POA: Diagnosis not present

## 2023-06-19 DIAGNOSIS — Z5900 Homelessness unspecified: Secondary | ICD-10-CM | POA: Diagnosis not present

## 2023-06-19 DIAGNOSIS — R519 Headache, unspecified: Secondary | ICD-10-CM | POA: Diagnosis not present

## 2023-06-19 DIAGNOSIS — H5713 Ocular pain, bilateral: Secondary | ICD-10-CM | POA: Diagnosis not present

## 2023-06-19 DIAGNOSIS — R5383 Other fatigue: Secondary | ICD-10-CM | POA: Diagnosis not present

## 2023-06-19 DIAGNOSIS — G4489 Other headache syndrome: Secondary | ICD-10-CM | POA: Diagnosis not present

## 2023-06-20 DIAGNOSIS — H5713 Ocular pain, bilateral: Secondary | ICD-10-CM | POA: Diagnosis not present

## 2023-06-20 DIAGNOSIS — Z5900 Homelessness unspecified: Secondary | ICD-10-CM | POA: Diagnosis not present

## 2023-06-21 DIAGNOSIS — R519 Headache, unspecified: Secondary | ICD-10-CM | POA: Diagnosis not present

## 2023-06-21 DIAGNOSIS — R5383 Other fatigue: Secondary | ICD-10-CM | POA: Diagnosis not present

## 2023-06-22 DIAGNOSIS — R609 Edema, unspecified: Secondary | ICD-10-CM | POA: Diagnosis not present

## 2023-06-23 DIAGNOSIS — Z765 Malingerer [conscious simulation]: Secondary | ICD-10-CM | POA: Diagnosis not present

## 2023-06-23 DIAGNOSIS — M79604 Pain in right leg: Secondary | ICD-10-CM | POA: Diagnosis not present

## 2023-06-23 DIAGNOSIS — F28 Other psychotic disorder not due to a substance or known physiological condition: Secondary | ICD-10-CM | POA: Diagnosis not present

## 2023-06-23 DIAGNOSIS — R519 Headache, unspecified: Secondary | ICD-10-CM | POA: Diagnosis not present

## 2023-06-23 DIAGNOSIS — Z136 Encounter for screening for cardiovascular disorders: Secondary | ICD-10-CM | POA: Diagnosis not present

## 2023-06-23 DIAGNOSIS — F29 Unspecified psychosis not due to a substance or known physiological condition: Secondary | ICD-10-CM | POA: Diagnosis not present

## 2023-06-23 DIAGNOSIS — F22 Delusional disorders: Secondary | ICD-10-CM | POA: Diagnosis not present

## 2023-06-23 DIAGNOSIS — M79605 Pain in left leg: Secondary | ICD-10-CM | POA: Diagnosis not present

## 2023-06-24 DIAGNOSIS — Z765 Malingerer [conscious simulation]: Secondary | ICD-10-CM | POA: Diagnosis not present

## 2023-06-24 DIAGNOSIS — F99 Mental disorder, not otherwise specified: Secondary | ICD-10-CM | POA: Diagnosis not present

## 2023-06-24 DIAGNOSIS — R519 Headache, unspecified: Secondary | ICD-10-CM | POA: Diagnosis not present

## 2023-06-24 DIAGNOSIS — I959 Hypotension, unspecified: Secondary | ICD-10-CM | POA: Diagnosis not present

## 2023-06-24 DIAGNOSIS — R55 Syncope and collapse: Secondary | ICD-10-CM | POA: Diagnosis not present

## 2023-06-25 DIAGNOSIS — Z136 Encounter for screening for cardiovascular disorders: Secondary | ICD-10-CM | POA: Diagnosis not present

## 2023-06-25 DIAGNOSIS — F22 Delusional disorders: Secondary | ICD-10-CM | POA: Diagnosis not present

## 2023-06-25 DIAGNOSIS — F29 Unspecified psychosis not due to a substance or known physiological condition: Secondary | ICD-10-CM | POA: Diagnosis not present

## 2023-06-25 DIAGNOSIS — F28 Other psychotic disorder not due to a substance or known physiological condition: Secondary | ICD-10-CM | POA: Diagnosis not present

## 2023-06-26 DIAGNOSIS — F22 Delusional disorders: Secondary | ICD-10-CM | POA: Diagnosis not present

## 2023-06-26 DIAGNOSIS — F29 Unspecified psychosis not due to a substance or known physiological condition: Secondary | ICD-10-CM | POA: Diagnosis not present

## 2023-06-27 DIAGNOSIS — F22 Delusional disorders: Secondary | ICD-10-CM | POA: Diagnosis not present

## 2023-06-27 DIAGNOSIS — F29 Unspecified psychosis not due to a substance or known physiological condition: Secondary | ICD-10-CM | POA: Diagnosis not present

## 2023-06-28 DIAGNOSIS — F29 Unspecified psychosis not due to a substance or known physiological condition: Secondary | ICD-10-CM | POA: Diagnosis not present

## 2023-06-28 DIAGNOSIS — F22 Delusional disorders: Secondary | ICD-10-CM | POA: Diagnosis not present

## 2023-07-10 DIAGNOSIS — G8929 Other chronic pain: Secondary | ICD-10-CM | POA: Diagnosis not present

## 2023-07-10 DIAGNOSIS — M545 Low back pain, unspecified: Secondary | ICD-10-CM | POA: Diagnosis not present

## 2023-07-10 DIAGNOSIS — M5489 Other dorsalgia: Secondary | ICD-10-CM | POA: Diagnosis not present

## 2023-07-12 DIAGNOSIS — M25569 Pain in unspecified knee: Secondary | ICD-10-CM | POA: Diagnosis not present

## 2023-07-12 DIAGNOSIS — R45851 Suicidal ideations: Secondary | ICD-10-CM | POA: Diagnosis not present

## 2023-07-12 DIAGNOSIS — Z59819 Housing instability, housed unspecified: Secondary | ICD-10-CM | POA: Diagnosis not present

## 2023-07-12 DIAGNOSIS — G8929 Other chronic pain: Secondary | ICD-10-CM | POA: Diagnosis not present

## 2023-07-12 DIAGNOSIS — M25561 Pain in right knee: Secondary | ICD-10-CM | POA: Diagnosis not present

## 2023-07-12 DIAGNOSIS — R519 Headache, unspecified: Secondary | ICD-10-CM | POA: Diagnosis not present

## 2023-07-12 DIAGNOSIS — G4489 Other headache syndrome: Secondary | ICD-10-CM | POA: Diagnosis not present

## 2023-07-12 DIAGNOSIS — M25511 Pain in right shoulder: Secondary | ICD-10-CM | POA: Diagnosis not present

## 2023-07-12 DIAGNOSIS — M545 Low back pain, unspecified: Secondary | ICD-10-CM | POA: Diagnosis not present

## 2023-07-12 DIAGNOSIS — Z765 Malingerer [conscious simulation]: Secondary | ICD-10-CM | POA: Diagnosis not present

## 2023-07-13 DIAGNOSIS — R45851 Suicidal ideations: Secondary | ICD-10-CM | POA: Diagnosis not present

## 2023-07-13 DIAGNOSIS — M545 Low back pain, unspecified: Secondary | ICD-10-CM | POA: Diagnosis not present

## 2023-07-13 DIAGNOSIS — G8929 Other chronic pain: Secondary | ICD-10-CM | POA: Diagnosis not present

## 2023-07-13 DIAGNOSIS — M79604 Pain in right leg: Secondary | ICD-10-CM | POA: Diagnosis not present

## 2023-07-13 DIAGNOSIS — M25561 Pain in right knee: Secondary | ICD-10-CM | POA: Diagnosis not present

## 2023-07-14 DIAGNOSIS — G8929 Other chronic pain: Secondary | ICD-10-CM | POA: Diagnosis not present

## 2023-07-14 DIAGNOSIS — M545 Low back pain, unspecified: Secondary | ICD-10-CM | POA: Diagnosis not present

## 2023-07-14 DIAGNOSIS — R45851 Suicidal ideations: Secondary | ICD-10-CM | POA: Diagnosis not present
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
## 2023-07-15 DIAGNOSIS — F250 Schizoaffective disorder, bipolar type: Secondary | ICD-10-CM | POA: Diagnosis not present

## 2023-07-22 DIAGNOSIS — R000 Tachycardia, unspecified: Secondary | ICD-10-CM | POA: Diagnosis not present

## 2023-07-22 DIAGNOSIS — R4182 Altered mental status, unspecified: Secondary | ICD-10-CM | POA: Diagnosis not present

## 2023-07-22 DIAGNOSIS — I10 Essential (primary) hypertension: Secondary | ICD-10-CM | POA: Diagnosis not present

## 2023-07-23 DIAGNOSIS — G8929 Other chronic pain: Secondary | ICD-10-CM | POA: Diagnosis not present

## 2023-07-23 DIAGNOSIS — R109 Unspecified abdominal pain: Secondary | ICD-10-CM | POA: Diagnosis not present

## 2023-08-05 ENCOUNTER — Telehealth: Payer: Self-pay

## 2023-08-05 DIAGNOSIS — Z7189 Other specified counseling: Secondary | ICD-10-CM

## 2023-08-05 NOTE — Progress Notes (Unsigned)
 Complex Care Management Note Care Guide Note  08/05/2023 Name: Caitlin Edwards MRN: [id number] DOB: [birth year]   Complex Care Management Outreach Attempts: An unsuccessful telephone outreach was attempted today to offer the patient information about available complex care management services.  Follow Up Plan:  Additional outreach attempts will be made to offer the patient complex care management information and services.   Encounter Outcome:  No Answer  Lenton Rail , RMA     [place]  [facility], [facility] Guide  Direct Dial: [phone]  Website: [email or web address]

## 2023-08-08 DIAGNOSIS — A0832 Astrovirus enteritis: Secondary | ICD-10-CM | POA: Diagnosis not present

## 2023-08-08 DIAGNOSIS — R000 Tachycardia, unspecified: Secondary | ICD-10-CM | POA: Diagnosis not present

## 2023-08-11 NOTE — Progress Notes (Unsigned)
 Complex Care Management Note Care Guide Note  08/11/2023 Name: Caitlin Edwards MRN: [id number] DOB: 04/22/1995   Complex Care Management Outreach Attempts: A second unsuccessful outreach was attempted today to offer the patient with information about available complex care management services.  Follow Up Plan:  Additional outreach attempts will be made to offer the patient complex care management information and services.   Encounter Outcome:  No Answer  Jeoffrey Buffalo , RMA     [place]  [facility], [facility] Guide  Direct Dial: [phone]  Website: [email or web address]

## 2023-08-12 NOTE — Progress Notes (Signed)
 Complex Care Management Note Care Guide Note  08/12/2023 Name: Jonelle Bann MRN: [id number] DOB: 27-Oct-1995   Complex Care Management Outreach Attempts: A third unsuccessful outreach was attempted today to offer the patient with information about available complex care management services.  Follow Up Plan:  Additional outreach attempts will be made to offer the patient complex care management information and services.   Encounter Outcome:  No Answer  Jeoffrey Buffalo , RMA     [place]  [facility], [facility] Guide  Direct Dial: [phone]  Website: [email or web address]
